# Patient Record
Sex: Female | Born: 2000 | Race: Black or African American | Hispanic: No | Marital: Single | Smoking: Never smoker
Health system: Southern US, Community
[De-identification: ages and names within clinical notes are randomized; demographics above are authoritative.]

## PROBLEM LIST (undated history)

## (undated) DIAGNOSIS — F909 Attention-deficit hyperactivity disorder, unspecified type: Secondary | ICD-10-CM

## (undated) DIAGNOSIS — J45909 Unspecified asthma, uncomplicated: Secondary | ICD-10-CM

---
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
## 2020-02-25 IMAGING — DX DG FOOT COMPLETE 3+V*L*
3 series · 3 of 3 positions shown · non-contrast
Comparison: None.

CLINICAL DATA: Left foot pain after kicking a wall. Initial
encounter.

EXAM:
LEFT FOOT - COMPLETE 3+ VIEW

[foot ap]
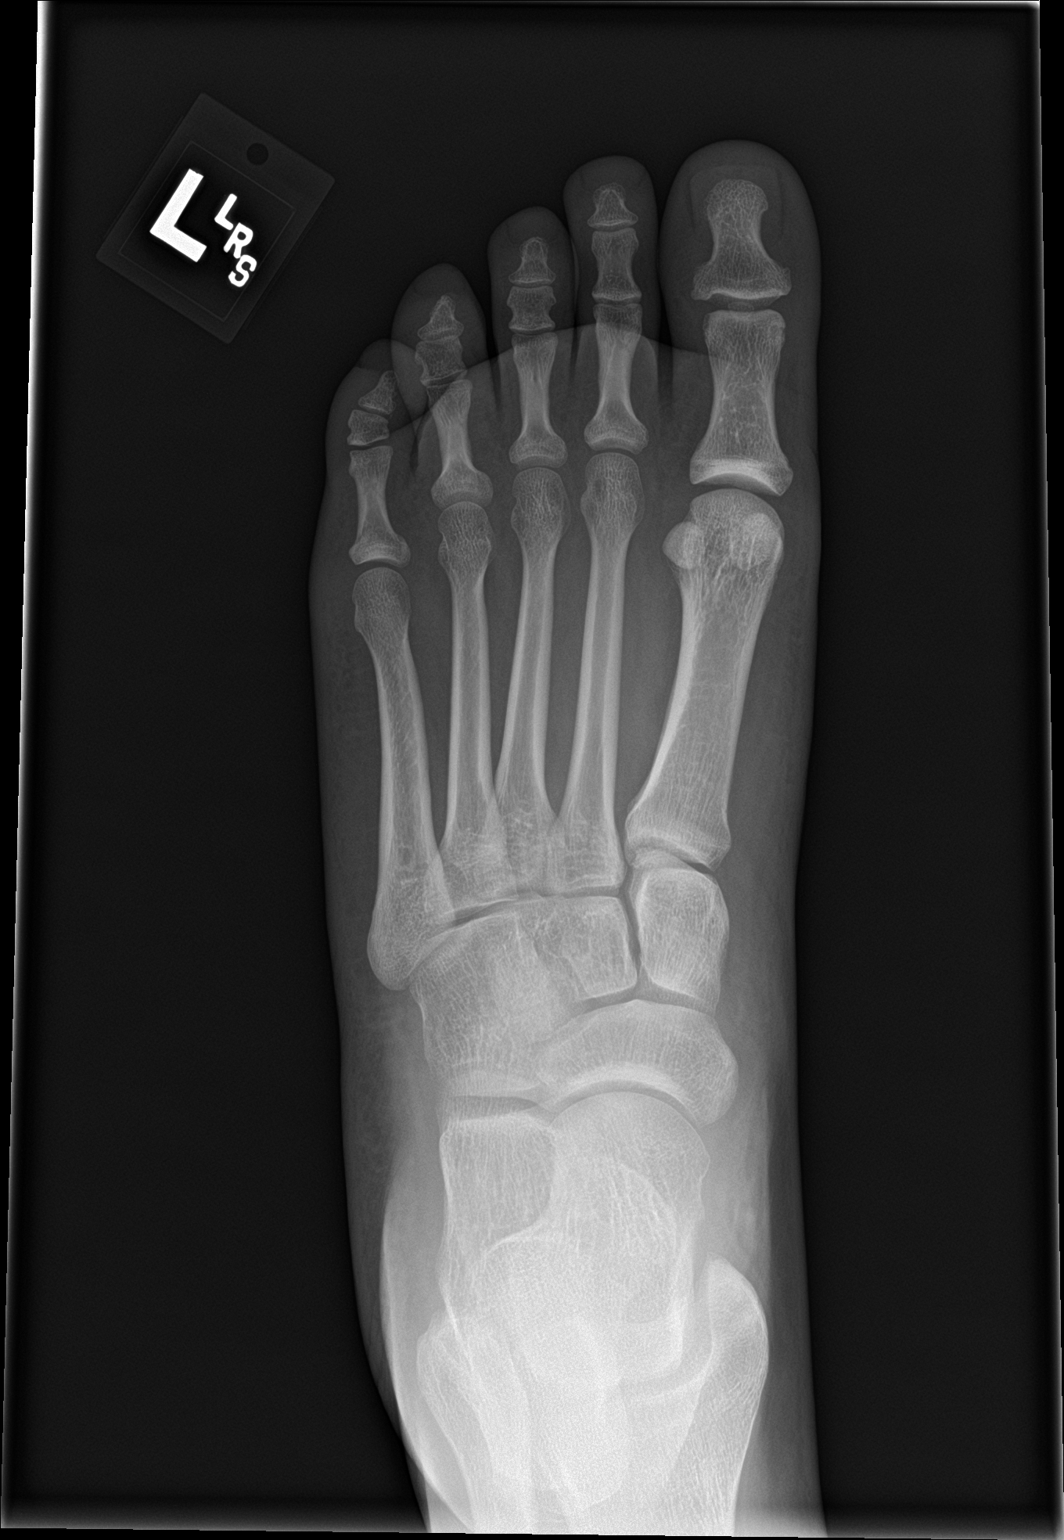

[foot obl]
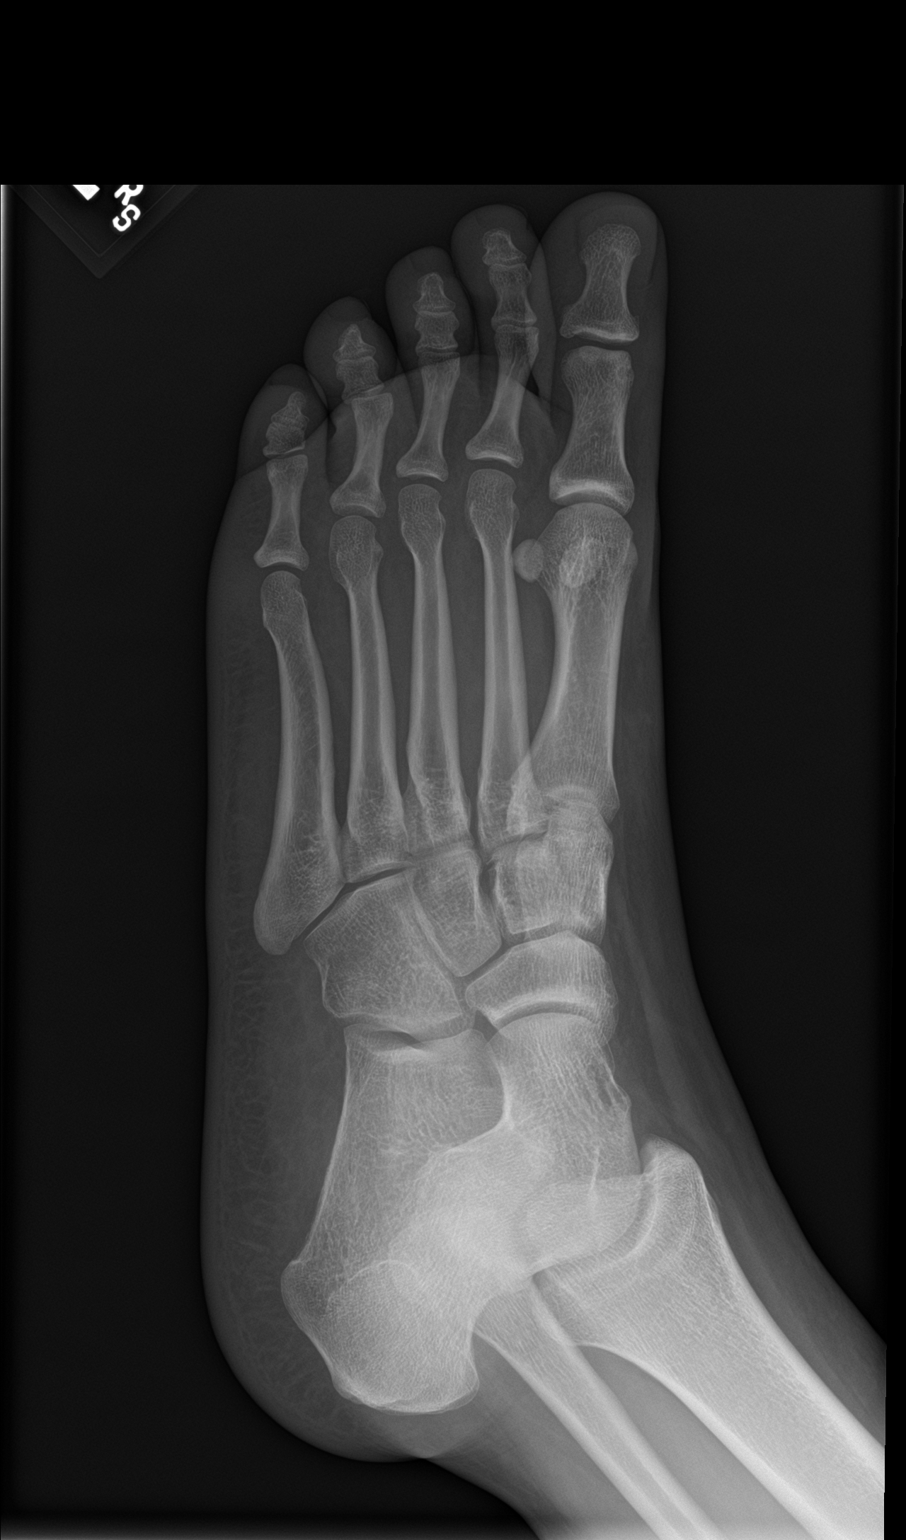

[foot lat]
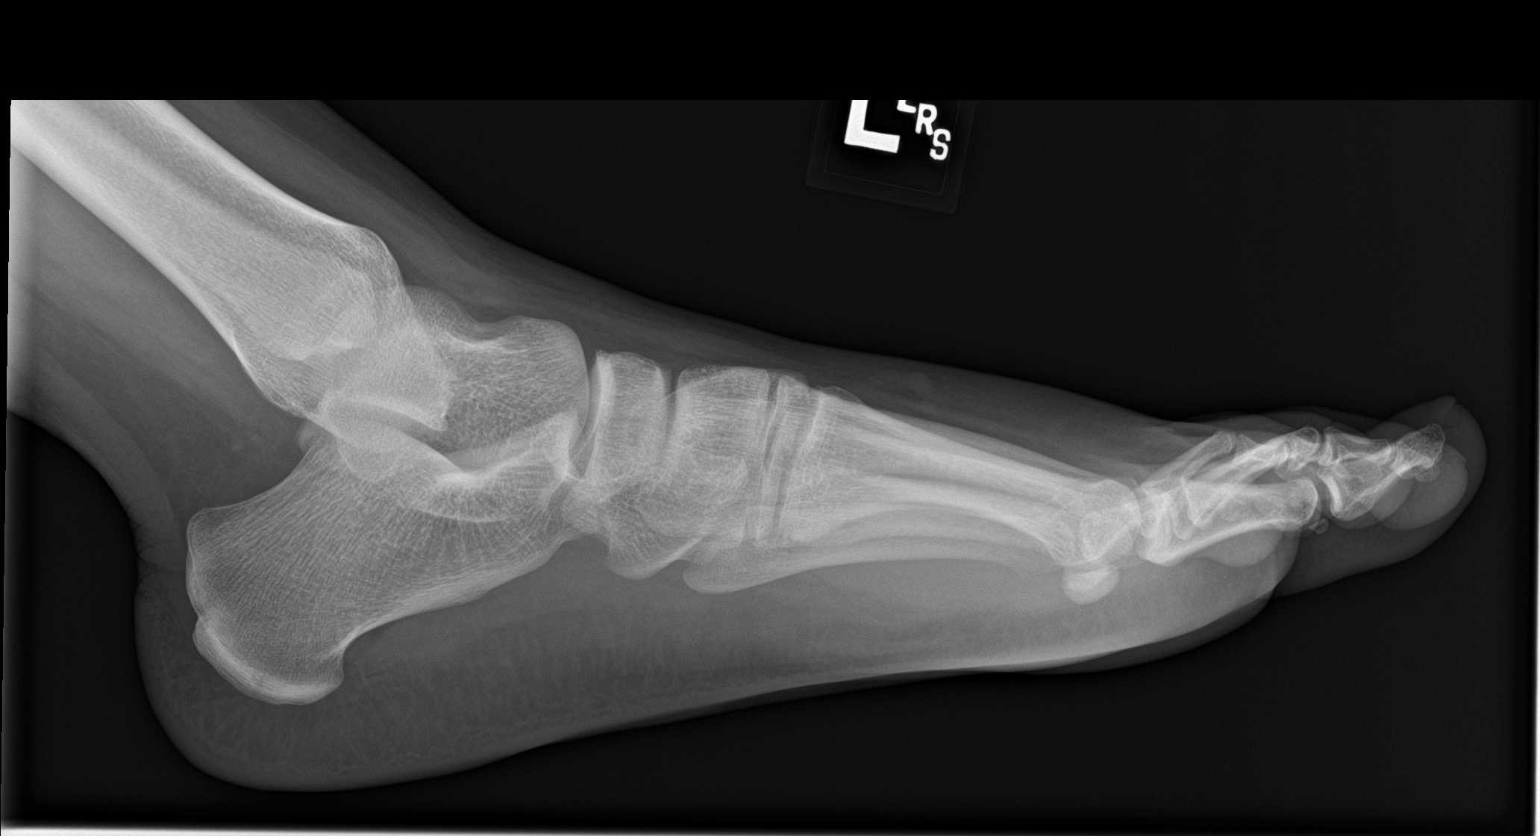

[3 of 3 positions shown; findings below may reference images not displayed]

FINDINGS: Mild offset at the fourth PIP joint without fracture, most prominent
on the oblique view.
IMPRESSION: 1. Mild subluxation of the fourth PIP joint, age-indeterminate.
2. Negative for fracture.
